# Patient Record
Sex: Male | Born: 1965 | Race: White | Hispanic: No | Marital: Married | Smoking: Never smoker
Health system: Southern US, Community
[De-identification: ages and names within clinical notes are randomized; demographics above are authoritative.]

## PROBLEM LIST (undated history)

## (undated) DIAGNOSIS — K5790 Diverticulosis of intestine, part unspecified, without perforation or abscess without bleeding: Secondary | ICD-10-CM

## (undated) DIAGNOSIS — K55039 Acute (reversible) ischemia of large intestine, extent unspecified: Secondary | ICD-10-CM

## (undated) HISTORY — DX: Diverticulosis of intestine, part unspecified, without perforation or abscess without bleeding: K57.90

## (undated) HISTORY — DX: Acute (reversible) ischemia of large intestine, extent unspecified: K55.039

---

## 2011-09-06 ENCOUNTER — Inpatient Hospital Stay (HOSPITAL_COMMUNITY)
Admission: EM | Admit: 2011-09-06 | Discharge: 2011-09-10 | DRG: 188 | Disposition: A | Discharge status: Home / Self Care | Payer: BC Managed Care – PPO | Source: Ambulatory Visit | Attending: Internal Medicine | Admitting: Internal Medicine

## 2011-09-06 ENCOUNTER — Emergency Department (HOSPITAL_COMMUNITY): Payer: BC Managed Care – PPO

## 2011-09-06 DIAGNOSIS — K559 Vascular disorder of intestine, unspecified: Principal | ICD-10-CM | POA: Diagnosis present

## 2011-09-06 DIAGNOSIS — I498 Other specified cardiac arrhythmias: Secondary | ICD-10-CM | POA: Diagnosis not present

## 2011-09-06 DIAGNOSIS — D72829 Elevated white blood cell count, unspecified: Secondary | ICD-10-CM | POA: Diagnosis present

## 2011-09-06 DIAGNOSIS — A09 Infectious gastroenteritis and colitis, unspecified: Secondary | ICD-10-CM | POA: Diagnosis present

## 2011-09-06 DIAGNOSIS — E876 Hypokalemia: Secondary | ICD-10-CM | POA: Diagnosis not present

## 2011-09-06 DIAGNOSIS — F17200 Nicotine dependence, unspecified, uncomplicated: Secondary | ICD-10-CM | POA: Diagnosis present

## 2011-09-06 DIAGNOSIS — I10 Essential (primary) hypertension: Secondary | ICD-10-CM | POA: Diagnosis present

## 2011-09-06 LAB — CBC
HCT: 43.9 % (ref 39.0–52.0)
MCV: 88.3 fL (ref 78.0–100.0)
Platelets: 236 10*3/uL (ref 150–400)
RBC: 4.97 MIL/uL (ref 4.22–5.81)
WBC: 22.1 10*3/uL — ABNORMAL HIGH (ref 4.0–10.5)

## 2011-09-06 LAB — COMPREHENSIVE METABOLIC PANEL
Albumin: 3.7 g/dL (ref 3.5–5.2)
Alkaline Phosphatase: 88 U/L (ref 39–117)
BUN: 11 mg/dL (ref 6–23)
Chloride: 106 mEq/L (ref 96–112)
Glucose, Bld: 111 mg/dL — ABNORMAL HIGH (ref 70–99)
Potassium: 3.6 mEq/L (ref 3.5–5.1)
Total Bilirubin: 0.7 mg/dL (ref 0.3–1.2)

## 2011-09-06 LAB — URINALYSIS, ROUTINE W REFLEX MICROSCOPIC
Glucose, UA: NEGATIVE mg/dL
Ketones, ur: >80 mg/dL — AB
Leukocytes, UA: NEGATIVE
Specific Gravity, Urine: 1.023 (ref 1.005–1.030)
pH: 5.5 (ref 5.0–8.0)

## 2011-09-06 LAB — DIFFERENTIAL
Lymphocytes Relative: 8 % — ABNORMAL LOW (ref 12–46)
Lymphs Abs: 1.7 10*3/uL (ref 0.7–4.0)
Neutro Abs: 18.9 10*3/uL — ABNORMAL HIGH (ref 1.7–7.7)
Neutrophils Relative %: 86 % — ABNORMAL HIGH (ref 43–77)

## 2011-09-06 LAB — LIPASE, BLOOD: Lipase: 63 U/L — ABNORMAL HIGH (ref 11–59)

## 2011-09-06 MED ORDER — IOHEXOL 300 MG/ML  SOLN
100.0000 mL | Freq: Once | INTRAMUSCULAR | Status: AC | PRN
  Administered 2011-09-06: 100 mL via INTRAVENOUS

## 2011-09-07 ENCOUNTER — Other Ambulatory Visit: Payer: Self-pay | Admitting: Gastroenterology

## 2011-09-07 DIAGNOSIS — R933 Abnormal findings on diagnostic imaging of other parts of digestive tract: Secondary | ICD-10-CM

## 2011-09-07 DIAGNOSIS — K5289 Other specified noninfective gastroenteritis and colitis: Secondary | ICD-10-CM

## 2011-09-07 DIAGNOSIS — K922 Gastrointestinal hemorrhage, unspecified: Secondary | ICD-10-CM

## 2011-09-07 DIAGNOSIS — R1032 Left lower quadrant pain: Secondary | ICD-10-CM

## 2011-09-07 LAB — BASIC METABOLIC PANEL
BUN: 8 mg/dL (ref 6–23)
CO2: 25 mEq/L (ref 19–32)
Calcium: 8.3 mg/dL — ABNORMAL LOW (ref 8.4–10.5)
Chloride: 104 mEq/L (ref 96–112)
Creatinine, Ser: 0.79 mg/dL (ref 0.50–1.35)
Glucose, Bld: 123 mg/dL — ABNORMAL HIGH (ref 70–99)

## 2011-09-07 LAB — CBC
HCT: 40.5 % (ref 39.0–52.0)
HCT: 41.6 % (ref 39.0–52.0)
Hemoglobin: 14.5 g/dL (ref 13.0–17.0)
Hemoglobin: 14.9 g/dL (ref 13.0–17.0)
MCH: 31.6 pg (ref 26.0–34.0)
MCHC: 35.8 g/dL (ref 30.0–36.0)
MCV: 88.2 fL (ref 78.0–100.0)
RBC: 4.59 MIL/uL (ref 4.22–5.81)
RBC: 4.72 MIL/uL (ref 4.22–5.81)
WBC: 20.2 10*3/uL — ABNORMAL HIGH (ref 4.0–10.5)

## 2011-09-08 DIAGNOSIS — K922 Gastrointestinal hemorrhage, unspecified: Secondary | ICD-10-CM

## 2011-09-08 DIAGNOSIS — R933 Abnormal findings on diagnostic imaging of other parts of digestive tract: Secondary | ICD-10-CM

## 2011-09-08 DIAGNOSIS — K5289 Other specified noninfective gastroenteritis and colitis: Secondary | ICD-10-CM

## 2011-09-08 DIAGNOSIS — R1032 Left lower quadrant pain: Secondary | ICD-10-CM

## 2011-09-08 LAB — FECAL LACTOFERRIN, QUANT: Fecal Lactoferrin: POSITIVE

## 2011-09-08 LAB — CBC
HCT: 40.2 % (ref 39.0–52.0)
MCH: 30.8 pg (ref 26.0–34.0)
MCV: 88.4 fL (ref 78.0–100.0)
Platelets: 209 10*3/uL (ref 150–400)
RBC: 4.55 MIL/uL (ref 4.22–5.81)
RDW: 13.1 % (ref 11.5–15.5)

## 2011-09-08 LAB — BASIC METABOLIC PANEL
BUN: 6 mg/dL (ref 6–23)
CO2: 23 mEq/L (ref 19–32)
Calcium: 8.4 mg/dL (ref 8.4–10.5)
Chloride: 104 mEq/L (ref 96–112)
Creatinine, Ser: 0.74 mg/dL (ref 0.50–1.35)

## 2011-09-08 LAB — MAGNESIUM: Magnesium: 1.9 mg/dL (ref 1.5–2.5)

## 2011-09-08 LAB — DIFFERENTIAL
Eosinophils Absolute: 0.4 10*3/uL (ref 0.0–0.7)
Eosinophils Relative: 3 % (ref 0–5)
Lymphocytes Relative: 13 % (ref 12–46)
Lymphs Abs: 2.2 10*3/uL (ref 0.7–4.0)
Monocytes Relative: 7 % (ref 3–12)

## 2011-09-09 DIAGNOSIS — K5289 Other specified noninfective gastroenteritis and colitis: Secondary | ICD-10-CM

## 2011-09-09 DIAGNOSIS — K922 Gastrointestinal hemorrhage, unspecified: Secondary | ICD-10-CM

## 2011-09-09 DIAGNOSIS — R933 Abnormal findings on diagnostic imaging of other parts of digestive tract: Secondary | ICD-10-CM

## 2011-09-09 DIAGNOSIS — R1032 Left lower quadrant pain: Secondary | ICD-10-CM

## 2011-09-09 LAB — DIFFERENTIAL
Basophils Absolute: 0 10*3/uL (ref 0.0–0.1)
Eosinophils Relative: 4 % (ref 0–5)
Lymphocytes Relative: 13 % (ref 12–46)
Lymphs Abs: 1.4 10*3/uL (ref 0.7–4.0)
Neutro Abs: 8.9 10*3/uL — ABNORMAL HIGH (ref 1.7–7.7)

## 2011-09-09 LAB — BASIC METABOLIC PANEL
BUN: 6 mg/dL (ref 6–23)
CO2: 24 mEq/L (ref 19–32)
Chloride: 105 mEq/L (ref 96–112)
Creatinine, Ser: 0.8 mg/dL (ref 0.50–1.35)
GFR calc Af Amer: >60 mL/min (ref >60)
Glucose, Bld: 182 mg/dL — ABNORMAL HIGH (ref 70–99)
Potassium: 3.5 mEq/L (ref 3.5–5.1)

## 2011-09-09 LAB — CBC
HCT: 38.4 % — ABNORMAL LOW (ref 39.0–52.0)
Hemoglobin: 13.8 g/dL (ref 13.0–17.0)
MCV: 87.9 fL (ref 78.0–100.0)
RBC: 4.37 MIL/uL (ref 4.22–5.81)
WBC: 11.3 10*3/uL — ABNORMAL HIGH (ref 4.0–10.5)

## 2011-09-10 ENCOUNTER — Encounter: Payer: Self-pay | Admitting: Gastroenterology

## 2011-09-10 LAB — CBC
HCT: 37.9 % — ABNORMAL LOW (ref 39.0–52.0)
Hemoglobin: 13.2 g/dL (ref 13.0–17.0)
MCHC: 34.8 g/dL (ref 30.0–36.0)
RBC: 4.33 MIL/uL (ref 4.22–5.81)
WBC: 8.4 10*3/uL (ref 4.0–10.5)

## 2011-09-10 LAB — BASIC METABOLIC PANEL
Chloride: 104 mEq/L (ref 96–112)
GFR calc non Af Amer: >90 mL/min (ref >90)
Glucose, Bld: 95 mg/dL (ref 70–99)
Potassium: 3.9 mEq/L (ref 3.5–5.1)
Sodium: 139 mEq/L (ref 135–145)

## 2011-09-10 LAB — GIARDIA/CRYPTOSPORIDIUM SCREEN(EIA): Cryptosporidium Screen (EIA): NEGATIVE

## 2011-09-10 NOTE — H&P (Signed)
NAMEHUDSYN, [place]                ACCOUNT NO.:  [id number]  MEDICAL RECORD NO.:  [id number]  LOCATION:  [facility]                         FACILITY:  [facility]  PHYSICIAN:  Osvaldo Shipper, MD     DATE OF BIRTH:  1966-08-01  DATE OF ADMISSION:  09/06/2011 DATE OF DISCHARGE:                             HISTORY & PHYSICAL   PRIMARY CARE PHYSICIAN:  Paulita Cradle, NP, with [facility].  ADMISSION DIAGNOSES: 1. Colitis of unclear etiology. 2. Tobacco abuse. 3. Elevated lipase.  CHIEF COMPLAINT:  Abdominal pain and bright red blood per rectum.  HISTORY OF PRESENT ILLNESS:  The patient is a 45 year old Caucasian male with a past medical history that is unremarkable.  He does not take any scheduled medications.  He was in his usual state of health until yesterday afternoon when he was at work and he started having lower abdominal cramps.  He felt like he needed to have a bowel movement.  He tried going to the bathroom but did not have any stool.  The pain in the lower abdomen persisted, it was a cramping pain.  He is unable to quantify the intensity of me.  He went home yesterday and then started having bright red blood per rectum.  The pain is located in the left lower abdomen.  He must have had multiple episodes of blood in his stool, although he denies large quantity.  It was small amount, no clots were noted.  This pretty much persisted throughout the night.  This morning, he went to see his doctor and was recommended to come into the emergency department for further evaluation.  He says he has had two episodes of blood in his stool, one was in the early 90s and actually had a colonoscopy at that time.  He does not remember who did the procedure but apparently no cause was found and then, he had another episode about 2 years ago which resolved on its own and he did not seek attention for that.  He had no nausea, vomiting.  The pain is better with morphine.  He  tried taking Imodium with no relief.  He had a temperature of 99.0 Fahrenheit.  He had some chills.  Denies any weight loss.  Denies any recent antibiotic use.  Denies any illicit drug use. No alcohol use.  The patient denied any black-colored stools.  MEDICATIONS AT HOME:  Just ibuprofen as needed, he takes at the most 1-2 tablets on a weekly basis and has not taken any in the last 2 days.  He is not on any scheduled medications.  No other as-needed medications.  ALLERGIES:  VICODIN which causes increased heart rate but he has tolerated Tylenol on its own.  PAST MEDICAL HISTORY:  Really unremarkable for any chronic medical problems.  He had some spinal infection 25 years ago, for which he required surgery and the rest is as above.  SOCIAL HISTORY:  He lives in [place] with his family.  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  Smokes two to three packs of cigarettes on a daily basis.  Drinks once or twice every couple of weeks and denies  any illicit drug use.  FAMILY HISTORY:  Father died of lung cancer.  Mother has mild diabetes. There is some heart disease in the family.  No history of any colon cancer or colitis in the family.  REVIEW OF SYSTEMS:  GENERAL:  Positive for weakness, malaise.  HEENT: Unremarkable.  CARDIOVASCULAR:  Unremarkable.  RESPIRATORY: Unremarkable.  GI:  As in HPI.  GU:  Unremarkable.  NEUROLOGIC: Unremarkable.  PSYCHIATRIC:  Unremarkable.  Other systems reviewed and found to be negative.  PHYSICAL EXAMINATION:  VITAL SIGNS:  Temperature here is 98.9, blood pressure 138/93, heart rate 99, respiratory rate is 18, saturation 99% on room air. GENERAL:  This is a well-developed, well-nourished white male, in no distress. HEENT:  Head is normocephalic, atraumatic.  Pupils are equal and reacting.  No pallor, no icterus.  Oral mucous membrane is moist.  No oral lesions are noted. NECK:  Soft and supple.  No thyromegaly is appreciated.  No  cervical, supraclavicular, or inguinal lymphadenopathy is present. LUNGS:  Clear to auscultation bilaterally with no wheezing, rales, or rhonchi. CARDIOVASCULAR:  S1 and S2 are normal and regular.  No S3, S4, rubs, murmurs, or bruit. ABDOMEN:  Soft.  Minimal tenderness in the left lower quadrant without any rebound, rigidity, or guarding.  No masses or organomegaly is appreciated.  Bowel sounds are present. RECTAL:  Done by the ED staff and stool was heme positive.  He had some maroon-colored stool apparently. MUSCULOSKELETAL:  Normal muscle mass and tone. NEUROLOGIC:  He is alert and oriented x3.  No focal neurological deficits are present. SKIN:  Does not reveal any rashes.  LABORATORY DATA:  His white cell count is 22.1 with 86% neutrophils. His hemoglobin is 16.1, MCV is 88, platelet count is 236.  Coags are normal.  Electrolytes are unremarkable.  Glucose was 111.  Lipase mildly elevated at 63.  UA showed large bilirubin and some ketones, and his fecal occult blood testing was positive.  IMAGING STUDIES:  He had a CT scan of his abdomen and pelvis which showed colitis beginning at the distal transverse colon and ending around the proximal sigmoid colon.  ASSESSMENT:  This is a 45 year old Caucasian male with a past medical history which is unremarkable who presents with abdominal pain, cramping, and bright red blood per rectum.  He has colitis based on his CT scan.  His pancreas appeared to be normal on the CT.  PLAN: 1. Colitis.  Due to the elevated white cell count, we will treat as if     this is infectious and put him on Cipro and Flagyl.  If he has more     diarrhea, stool studies will be sent.  Etiology for his colitis is     not entirely clear.  He may need to have a colonoscopy.  I have     discussed this with Dr. Arlyce Dice with [facility] who will see the     patient in the morning.  The patient was supposed to see [facility]     sometime this week for the same  problem. 2. Tobacco abuse.  He was counseled to quit smoking.  He is declining     a nicotine patch at this time.  If he changes his mind, we will be     happy to prescribe. 3. Elevated lipase, etiology unclear.  It probably is due to his other     GI issues.  There is no suspicion for pancreatitis at this time. 4. DVT  prophylaxis with SCDs.  We will recheck a CBC to monitor his hemoglobin just in case it drops.  The patient is a full code.  Further management and decisions will depend on results of further testing and patient's response to treatment.    Osvaldo Shipper, MD     GK/MEDQ  D:  09/06/2011  T:  09/06/2011  Job:  [id number]  cc:   Paulita Cradle, NP [facility]  Electronically Signed by Osvaldo Shipper MD on 09/10/2011 07:21:29 PM

## 2011-09-11 LAB — STOOL CULTURE

## 2011-09-17 ENCOUNTER — Telehealth: Payer: Self-pay | Admitting: Gastroenterology

## 2011-09-17 NOTE — Discharge Summary (Signed)
[place], [place]                ACCOUNT NO.:  [id number]  MEDICAL RECORD NO.:  [id number]  LOCATION:  [facility]                         FACILITY:  [facility]  PHYSICIAN:  [place] Barefoot, MD    DATE OF BIRTH:  1966-08-15  DATE OF ADMISSION:  09/06/2011 DATE OF DISCHARGE:  09/10/2011                        DISCHARGE SUMMARY - REFERRING   DISCHARGE DIAGNOSES: 1. Severe segmental colitis, ischemic versus infectious. 2. Tobacco use. 3. Hypokalemia secondary to diarrhea, repleted. 4. Leukocytosis secondary to colitis.  DISCHARGE MEDICATIONS: 1. Ciprofloxacin 500 mg p.o. b.i.d. for three more days. 2. Flagyl 500 mg q.8 h. for three more days. 3. Protonix 40 mg p.o. daily.  PROCEDURE PERFORMED:  Colonoscopy show severe segmental colitis, descending to transverse colon.  Mild diverticulosis in the sigmoid colon. Recommend colonoscopy in 6 years at the age of 37, routine colorectal cancer screening.  Procedure performed date was September 07, 2011.  BRIEF HISTORY OF PRESENT ILLNESS:  This is a very pleasant 45 year old with past medical history that is unremarkable.  He does not take any scheduled medication.  He was in his usual state of health until the day prior to admission when he was at work and he was starting having lower abdominal pain.  Crampy in nature.  He felt that he needed to have a bowel movement.  Then, he went home and he started to have bright red blood per rectum.  The pain is located in the left lower quadrant.  He must have had multiple episodes of blood in his stool, although, he denies large quantity.  He had a prior episode of blood in the stool 2 years ago that resolved on its own.  He did not seek any medical attention.  He also had an episode of blood in the stool in the early 90s and had a colonoscopy at that time and apparently no cause was found then.  He had tried taking Imodium with no relief.  He had a temperature of 99.  HOSPITAL COURSE: 1. Severe  segmental colitis.  The patient was admitted to the     hospital, started on IV fluids.  GI was consulted due to prior     episodes to rule out Crohn disease or ischemic colitis, infectious     colitis.  The patient had a colonoscopy which result as above.     Biopsy is pending at this time.  C. diff was negative.  The patient     had a significant elevation on his white blood cell count at 22 on     admission.  These over course of hospitalization decreased.  He was     started on Flagyl and Cipro to cover for infectious organisms.  The     course of hospitalization, the patient abdominal pain improved, his     diarrhea improved also.  No significant amount of blood on the     stool.  When he was able to tolerate diet and he was afebrile and     white blood cell count were trending down, the patient was     discharged home.  The patient will need to have three more days of  antibiotics.  He will need to follow up with Dr. Russella Dar for result     of biopsy. 2. Elevated lipase, very mild elevation in the setting of an acute     illness. 3. Tobacco use.  The patient was counseled to quit smoking.  On the day of discharge, the patient was in improved condition.  No     significant abdominal pain.  No blood in the stool.  He was     tolerating diet.  Sodium 139, potassium 3.9, chloride 104, bicarb 29, BUN 8, creatinine 0.9, glucose 95.  White blood cell 8.4, hemoglobin 12.2, platelet 259. C. diff negative.  Blood pressure 129/86, respirations 16, pulse 68, temperature 98.     [place] Barefoot, MD     BR/MEDQ  D:  09/12/2011  T:  09/12/2011  Job:  [id number]  Electronically Signed by [place] Barefoot MD on 09/17/2011 07:08:10 PM

## 2011-09-17 NOTE — Telephone Encounter (Signed)
I have reviewed the results of the path report with the patient .  He is asked to keep hi follow up appt for 10/02/11

## 2011-09-17 NOTE — Telephone Encounter (Signed)
Left message for patient to call back  

## 2011-10-02 ENCOUNTER — Ambulatory Visit: Payer: BC Managed Care – PPO | Admitting: Gastroenterology

## 2011-10-25 ENCOUNTER — Other Ambulatory Visit: Payer: Self-pay | Admitting: Internal Medicine

## 2012-06-21 IMAGING — CT CT ABD-PELV W/ CM
2 of 5 series · 17 of 46 positions shown, 19 images · IV contrast (APPLIED)
Comparison: None

CLINICAL DATA: abdominal pain and rectal bleeding

CT ABDOMEN AND PELVIS WITH CONTRAST
TECHNIQUE: Multidetector CT imaging of the abdomen and pelvis was
performed following the standard protocol during bolus
administration of intravenous contrast.
Contrast: 100mL OMNIPAQUE IOHEXOL 300 MG/ML IV SOLN

[Series 2: abd/pelv with 5.0 b31f st · axial · 0.77mm/px · z∈[+687,+1122]mm · 14 of 99 slices shown, 16 images]
[im 6/99  soft-tissue]
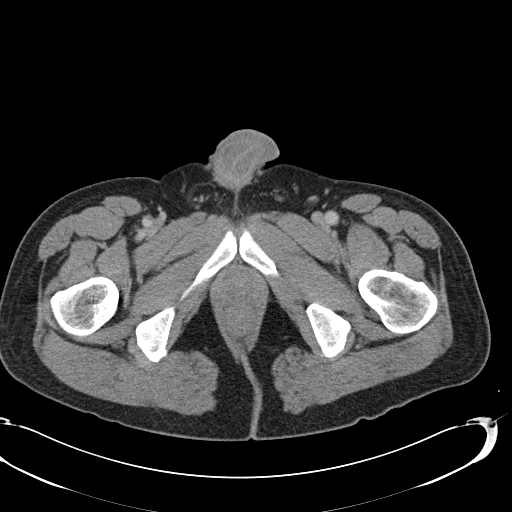
[im 6/99  bone]
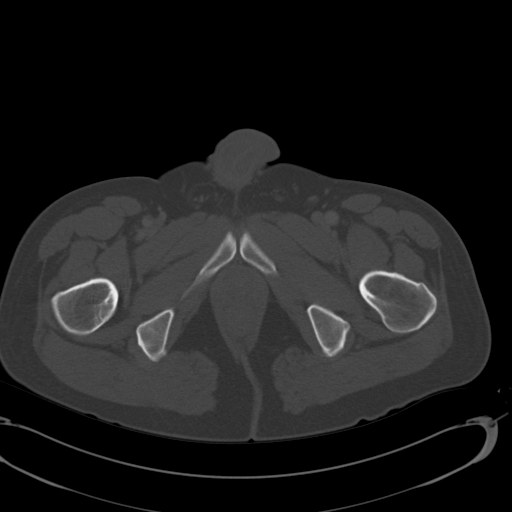
[im 11/99  soft-tissue]
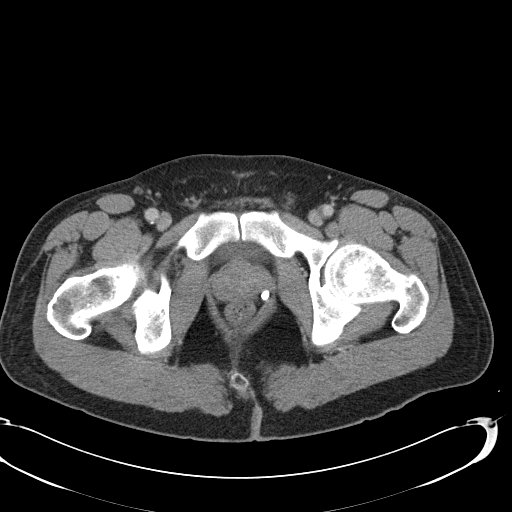
[im 21/99  soft-tissue]
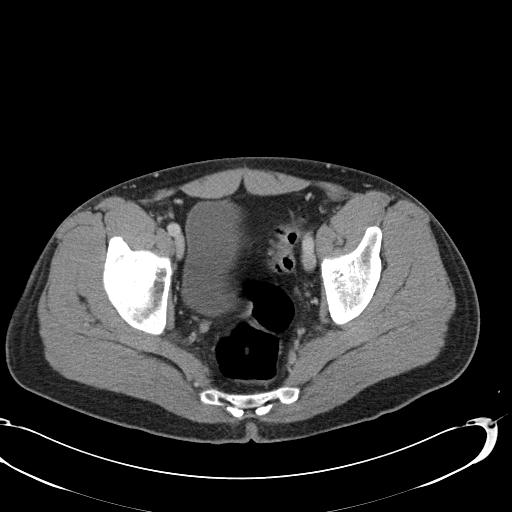
[im 26/99  soft-tissue]
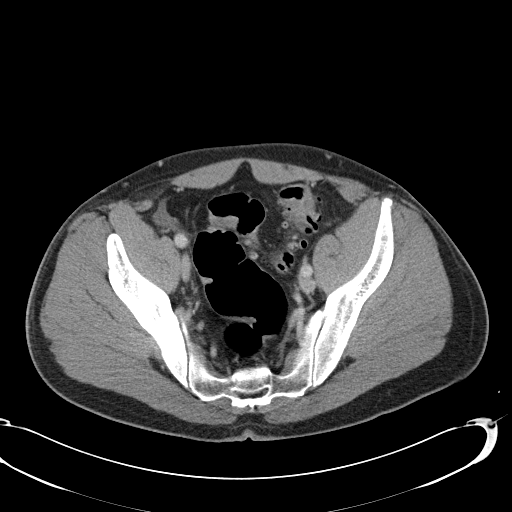
[im 31/99  soft-tissue]
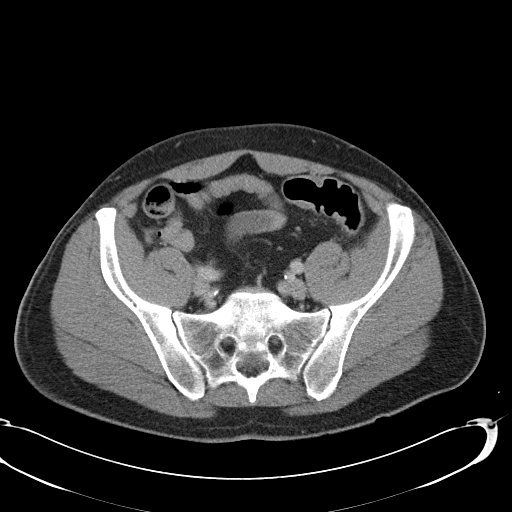
[im 42/99  soft-tissue]
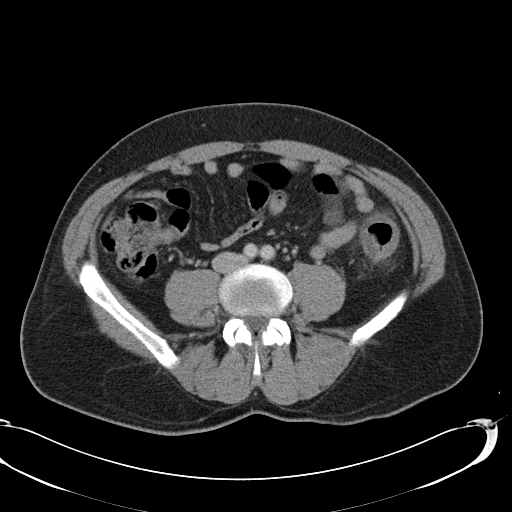
[im 47/99  soft-tissue]
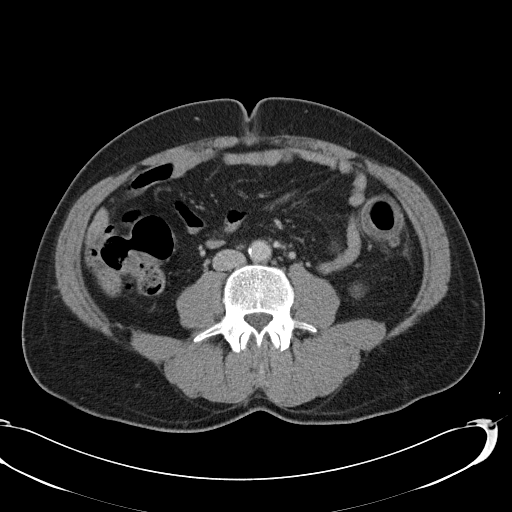
[im 52/99  soft-tissue]
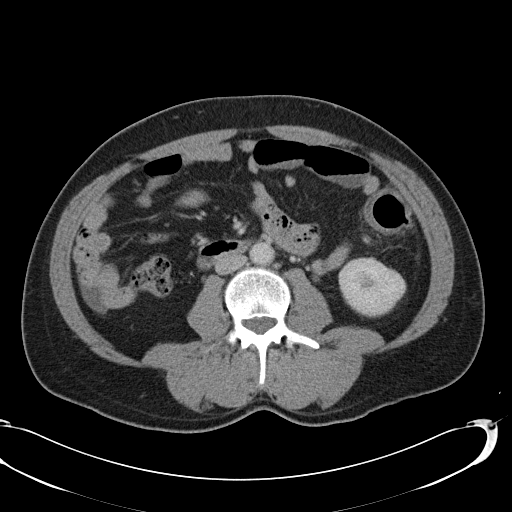
[im 57/99  soft-tissue]
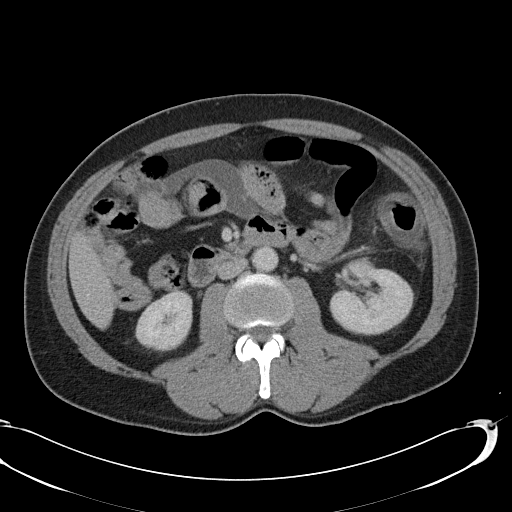
[im 57/99  bone]
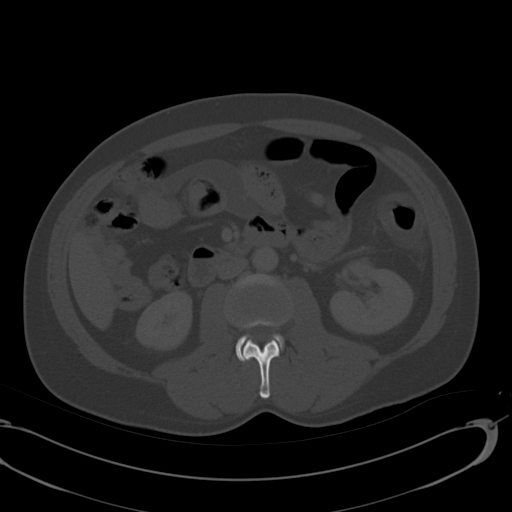
[im 68/99  soft-tissue]
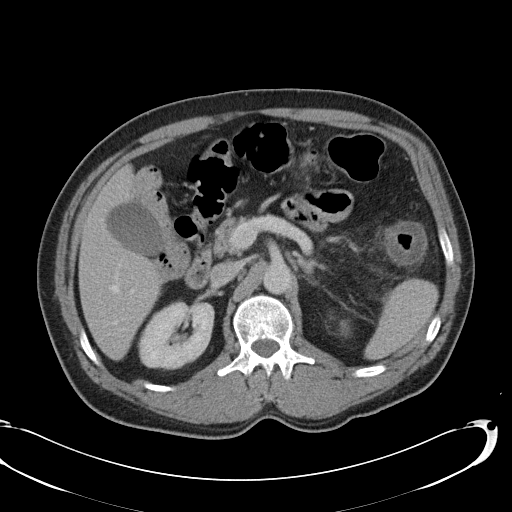
[im 73/99  soft-tissue]
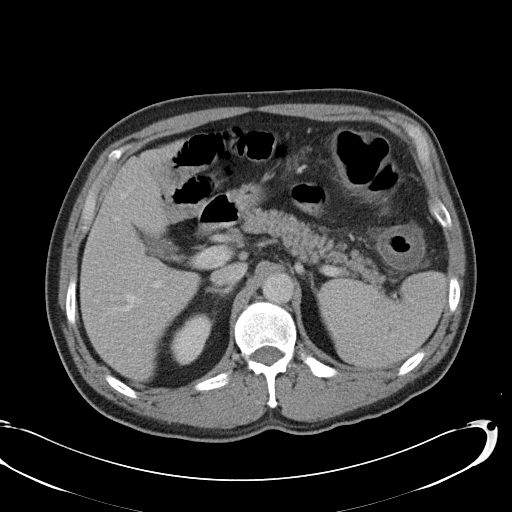
[im 78/99  soft-tissue]
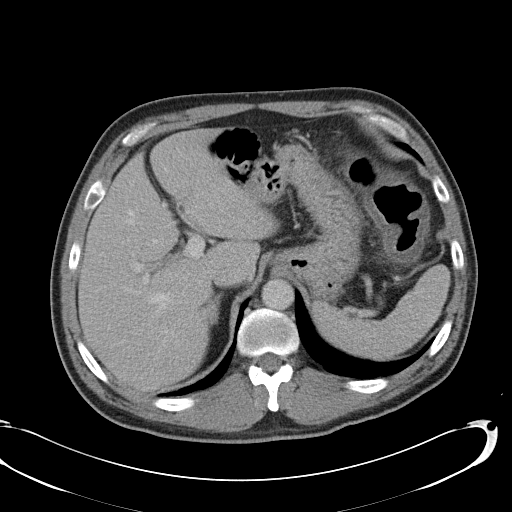
[im 88/99  soft-tissue]
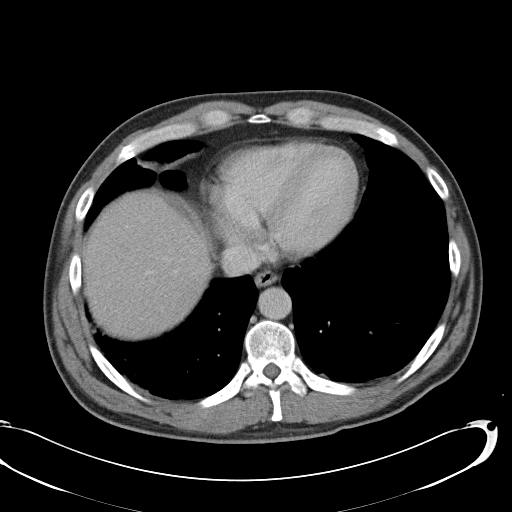
[im 93/99  soft-tissue]
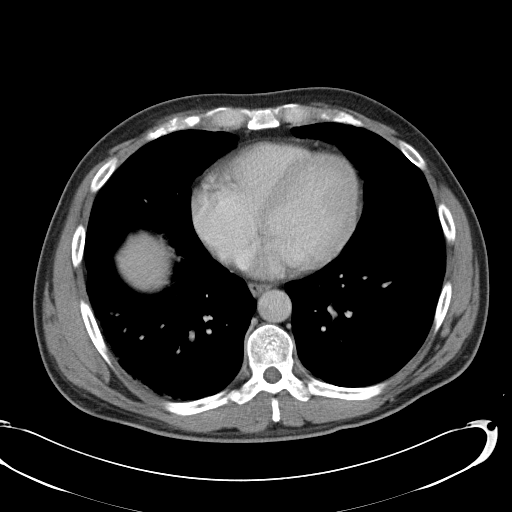

[Series 5: abd/pelv with 2.0 spo st · coronal · 0.96mm/px · 3 of 120 slices shown]
[im 40/120  soft-tissue]
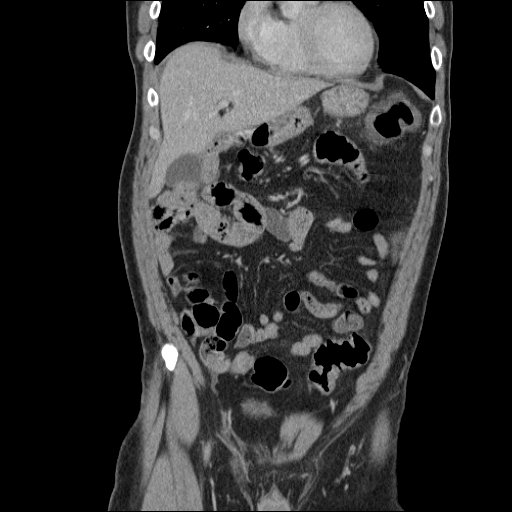
[im 53/120  soft-tissue]
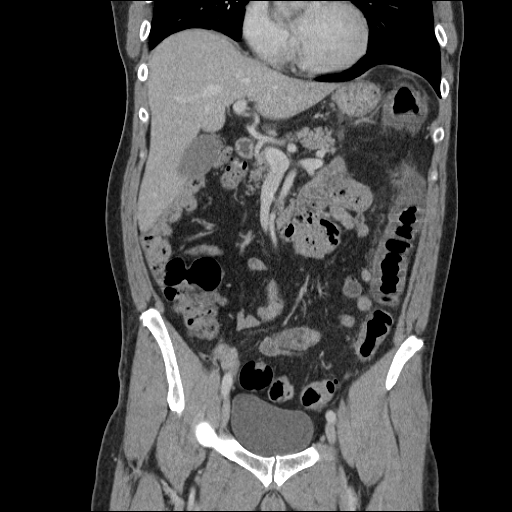
[im 67/120  soft-tissue]
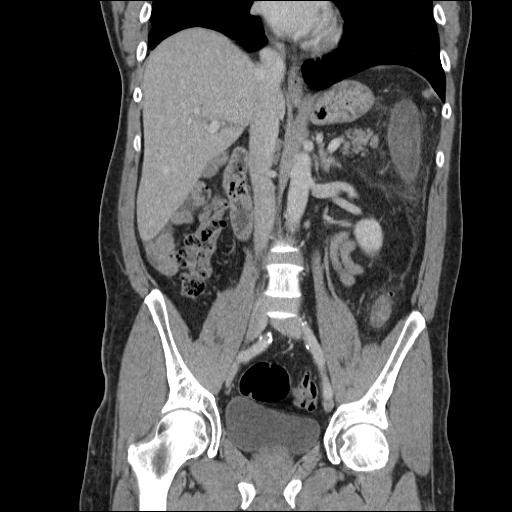

[17 of 46 positions shown; findings below may reference images not displayed]

FINDINGS: Subsegmental atelectasis noted in the right base.  No
pericardial or pleural effusion.

There is a small amount of perihepatic ascites.

No focal liver abnormality.  Gallbladder appears normal.

Pancreas is normal.

There is a normal appearance to the spleen.  The adrenal glands are
normal.  The right kidney is normal.  Indeterminant intermediate
density exophytic lesion arising from the inferior pole of the left
kidney is identified measuring 1.8 cm, image 52.

No upper abdominal adenopathy.

There is no pelvic or inguinal adenopathy.

Urinary bladder appears normal.

The stomach and the small bowel loops are normal.

The appendix is identified and appears normal.

From the distal transverse colon to the proximal sigmoid colon
there is abnormal wall thickening, mucosal enhancement, and
pericolonic inflammatory change compatible with segmental colitis.

A small amount of free fluid is identified within the left abdomen
and extending along the left pericolic gutter.

There is no evidence for bowel perforation, pneumatosis, or free
intraperitoneal air.  No abscess is identified.

Review of the visualized osseous structures is unremarkable.
IMPRESSION: 1.  Examination is positive for colitis which begins at the distal
transverse colon and hands within the proximal sigmoid colon.  This
may be inflammatory or infectious in etiology.  Ischemic colitis is
not excluded.

## 2013-09-07 ENCOUNTER — Other Ambulatory Visit: Payer: Self-pay | Admitting: Nurse Practitioner

## 2013-09-18 ENCOUNTER — Ambulatory Visit (INDEPENDENT_AMBULATORY_CARE_PROVIDER_SITE_OTHER): Payer: BC Managed Care – PPO | Admitting: Family Medicine

## 2013-09-18 ENCOUNTER — Encounter: Payer: Self-pay | Admitting: Family Medicine

## 2013-09-18 ENCOUNTER — Encounter (INDEPENDENT_AMBULATORY_CARE_PROVIDER_SITE_OTHER): Payer: Self-pay

## 2013-09-18 VITALS — BP 136/93 | HR 84 | Temp 97.6°F | Ht 72.0 in | Wt 209.8 lb

## 2013-09-18 DIAGNOSIS — I10 Essential (primary) hypertension: Secondary | ICD-10-CM

## 2013-09-18 DIAGNOSIS — N529 Male erectile dysfunction, unspecified: Secondary | ICD-10-CM

## 2013-09-18 DIAGNOSIS — R0602 Shortness of breath: Secondary | ICD-10-CM

## 2013-09-18 MED ORDER — SILDENAFIL CITRATE 100 MG PO TABS: 50.0000 mg | ORAL_TABLET | Freq: Every day | ORAL | Status: AC | PRN

## 2013-09-18 MED ORDER — LISINOPRIL 20 MG PO TABS: 20.0000 mg | ORAL_TABLET | Freq: Every day | ORAL | Status: AC

## 2013-09-18 MED ORDER — UMECLIDINIUM-VILANTEROL 62.5-25 MCG/INH IN AEPB: 1.0000 | INHALATION_SPRAY | RESPIRATORY_TRACT | Status: DC

## 2013-09-18 NOTE — Progress Notes (Signed)
  Subjective:    Patient ID: [place], male    DOB: March 14, 1966, 47 y.o.   MRN: [id number]  HPI This 47 y.o. male presents for evaluation of hypertension.  He has been out of his blood pressure Medicine for 5 days. He has been a heavy smoker in the past and he has quit and he notices he  Gets SOB when exerting himself..   Review of Systems No chest pain, SOB, HA, dizziness, vision change, N/V, diarrhea, constipation, dysuria, urinary urgency or frequency, myalgias, arthralgias or rash.     Objective:   Physical Exam Vital signs noted  Well developed well nourished male.  HEENT - Head atraumatic Normocephalic                Eyes - PERRLA, Conjuctiva - clear Sclera- Clear EOMI                Ears - EAC's Wnl TM's Wnl Gross Hearing WNL                Nose - Nares patent                 Throat - oropharanx wnl Respiratory - Lungs CTA bilateral Cardiac - RRR S1 and S2 without murmur GI - Abdomen soft Nontender and bowel sounds active x 4 Extremities - No edema. Neuro - Grossly intact.       Assessment & Plan:  Shortness of breath - Plan: Umeclidinium-Vilanterol (ANORO ELLIPTA) 62.5-25 MCG/INH AEPB  Erectile dysfunction - Plan: sildenafil (VIAGRA) 100 MG tablet  Essential hypertension, benign - Plan: lisinopril (PRINIVIL,ZESTRIL) 20 MG tablet  Deatra Canter FNP

## 2013-09-23 NOTE — Patient Instructions (Signed)

## 2013-10-12 ENCOUNTER — Other Ambulatory Visit: Payer: Self-pay | Admitting: Family Medicine

## 2013-10-12 ENCOUNTER — Telehealth: Payer: Self-pay | Admitting: Family Medicine

## 2013-10-12 MED ORDER — IPRATROPIUM-ALBUTEROL 20-100 MCG/ACT IN AERS: 1.0000 | INHALATION_SPRAY | Freq: Four times a day (QID) | RESPIRATORY_TRACT | Status: DC | PRN

## 2013-10-12 NOTE — Telephone Encounter (Signed)
Please call and tell him I sent in a combivent inhaler for him

## 2013-10-14 ENCOUNTER — Telehealth: Payer: Self-pay | Admitting: Family Medicine

## 2013-10-14 NOTE — Telephone Encounter (Signed)
combivent was $130  Can we change to PRo Air hfa- Albuterol Would be MUCH cheaper.

## 2013-10-14 NOTE — Telephone Encounter (Signed)
Note already sent to [facility]

## 2013-10-15 ENCOUNTER — Other Ambulatory Visit: Payer: Self-pay | Admitting: Family Medicine

## 2013-10-15 MED ORDER — ALBUTEROL SULFATE HFA 108 (90 BASE) MCG/ACT IN AERS: 2.0000 | INHALATION_SPRAY | Freq: Four times a day (QID) | RESPIRATORY_TRACT | Status: AC | PRN

## 2013-10-15 NOTE — Telephone Encounter (Signed)
rx for albuterol mdi sent to pharmacy

## 2013-10-16 NOTE — Telephone Encounter (Signed)
Pt's wife notified RX called into pharmacy

## 2014-01-14 ENCOUNTER — Encounter (INDEPENDENT_AMBULATORY_CARE_PROVIDER_SITE_OTHER): Payer: Self-pay

## 2014-01-14 ENCOUNTER — Ambulatory Visit (INDEPENDENT_AMBULATORY_CARE_PROVIDER_SITE_OTHER): Payer: BC Managed Care – PPO | Admitting: Nurse Practitioner

## 2014-01-14 ENCOUNTER — Encounter: Payer: Self-pay | Admitting: Nurse Practitioner

## 2014-01-14 VITALS — BP 137/86 | HR 81 | Temp 97.5°F | Ht 72.0 in | Wt 209.0 lb

## 2014-01-14 DIAGNOSIS — S43109A Unspecified dislocation of unspecified acromioclavicular joint, initial encounter: Secondary | ICD-10-CM

## 2014-01-14 DIAGNOSIS — M25511 Pain in right shoulder: Secondary | ICD-10-CM

## 2014-01-14 DIAGNOSIS — M25519 Pain in unspecified shoulder: Secondary | ICD-10-CM

## 2014-01-14 NOTE — Progress Notes (Signed)
   Subjective:    Patient ID: [place] GlazierDavid Stevenson, male    DOB: 1966-07-06, 48 y.o.   MRN: [id number]  HPI Patient said that he i shaving right shoulder pain that astarted 3 weeks ago- not sure what he did to it- pain is intermittent- hurts worse when he moves arm in certain directions.denies nubness and tingling distally.    Review of Systems  Cardiovascular: Negative.   Gastrointestinal: Negative.   Musculoskeletal: Positive for arthralgias.  Neurological: Negative.   Psychiatric/Behavioral: Negative.   All other systems reviewed and are negative.       Objective:   Physical Exam  Constitutional: He appears well-developed and well-nourished.  Cardiovascular: Normal rate, regular rhythm and normal heart sounds.   Pulmonary/Chest: Effort normal and breath sounds normal.  Musculoskeletal:  Decreased ROM of right shoulder with pain on abduction.  Knot along right AC joint Grips equally bilaterally- motor strength and sensation distally intact.   BP 137/86  Pulse 81  Temp(Src) 97.5 F (36.4 C) (Oral)  Ht 6' (1.829 m)  Wt 209 lb (94.802 kg)  BMI 28.34 kg/m2        Assessment & Plan:   1. Right shoulder pain   2. Acromioclavicular separation, type 1    Motrin or tylenol OTC as needed No strenous activity RTO prn  Mary-Margaret Daphine DeutscherMartin, FNP

## 2014-01-14 NOTE — Patient Instructions (Signed)
Acromioclavicular Injuries  The AC (acromioclavicular) joint is the joint in the shoulder where the collarbone (clavicle) meets the shoulder blade (scapula). The part of the shoulder blade connected to the collarbone is called the acromion. Common problems with and treatments for the AC joint are detailed below.  ARTHRITIS  Arthritis occurs when the joint has been injured and the smooth padding between the joints (cartilage) is lost. This is the wear and tear seen in most joints of the body if they have been overused. This causes the joint to produce pain and swelling which is worse with activity.   AC JOINT SEPARATION  AC joint separation means that the ligaments connecting the acromion of the shoulder blade and collarbone have been damaged, and the two bones no longer line up. AC separations can be anywhere from mild to severe, and are "graded" depending upon which ligaments are torn and how badly they are torn.   Grade I Injury: the least damage is done, and the AC joint still lines up.   Grade II Injury: damage to the ligaments which reinforce the AC joint. In a Grade II injury, these ligaments are stretched but not entirely torn. When stressed, the AC joint becomes painful and unstable.   Grade III Injury: AC and secondary ligaments are completely torn, and the collarbone is no longer attached to the shoulder blade. This results in deformity; a prominence of the end of the clavicle.  AC JOINT FRACTURE  AC joint fracture means that there has been a break in the bones of the AC joint, usually the end of the clavicle.  TREATMENT  TREATMENT OF AC ARTHRITIS   There is currently no way to replace the cartilage damaged by arthritis. The best way to improve the condition is to decrease the activities which aggravate the problem. Application of ice to the joint helps decrease pain and soreness (inflammation). The use of non-steroidal anti-inflammatory medication is helpful.   If less conservative measures do not  work, then cortisone shots (injections) may be used. These are anti-inflammatories; they decrease the soreness in the joint and swelling.   If non-surgical measures fail, surgery may be recommended. The procedure is generally removal of a portion of the end of the clavicle. This is the part of the collarbone closest to your acromion which is stabilized with ligaments to the acromion of the shoulder blade. This surgery may be performed using a tube-like instrument with a light (arthroscope) for looking into a joint. It may also be performed as an open surgery through a small incision by the surgeon. Most patients will have good range of motion within 6 weeks and may return to all activity including sports by 8-12 weeks, barring complications.  TREATMENT OF AN AC SEPARATION   The initial treatment is to decrease pain. This is best accomplished by immobilizing the arm in a sling and placing an ice pack to the shoulder for 20 to 30 minutes every 2 hours as needed. As the pain starts to subside, it is important to begin moving the fingers, wrist, elbow and eventually the shoulder in order to prevent a stiff or "frozen" shoulder. Instruction on when and how much to move the shoulder will be provided by your caregiver. The length of time needed to regain full motion and function depends on the amount or grade of the injury. Recovery from a Grade I AC separation usually takes 10 to 14 days, whereas a Grade III may take 6 to 8 weeks.   Grade   I and II separations usually do not require surgery. Even Grade III injuries usually allow return to full activity with few restrictions. Treatment is also based on the activity demands of the injured shoulder. For example, a high level quarterback with an injured throwing arm will receive more aggressive treatment than someone with a desk job who rarely uses his/her arm for strenuous activities. In some cases, a painful lump may persist which could require a later surgery. Surgery  can be very successful, but the benefits must be weighed against the potential risks.  TREATMENT OF AN AC JOINT FRACTURE  Fracture treatment depends on the type of fracture. Sometimes a splint or sling may be all that is required. Other times surgery may be required for repair. This is more frequently the case when the ligaments supporting the clavicle are completely torn. Your caregiver will help you with these decisions and together you can decide what will be the best treatment.  HOME CARE INSTRUCTIONS    Apply ice to the injury for 15-20 minutes each hour while awake for 2 days. Put the ice in a plastic bag and place a towel between the bag of ice and skin.   If a sling has been applied, wear it constantly for as long as directed by your caregiver, even at night. The sling or splint can be removed for bathing or showering or as directed. Be sure to keep the shoulder in the same place as when the sling is on. Do not lift the arm.   If a figure-of-eight splint has been applied it should be tightened gently by another person every day. Tighten it enough to keep the shoulders held back. Allow enough room to place the index finger between the body and strap. Loosen the splint immediately if there is numbness or tingling in the hands.   Take over-the-counter or prescription medicines for pain, discomfort or fever as directed by your caregiver.   If you or your child has received a follow up appointment, it is very important to keep that appointment in order to avoid long term complications, chronic pain or disability.  SEEK MEDICAL CARE IF:    The pain is not relieved with medications.   There is increased swelling or discoloration that continues to get worse rather than better.   You or your child has been unable to follow up as instructed.   There is progressive numbness and tingling in the arm, forearm or hand.  SEEK IMMEDIATE MEDICAL CARE IF:    The arm is numb, cold or pale.   There is increasing pain  in the hand, forearm or fingers.  MAKE SURE YOU:    Understand these instructions.   Will watch your condition.   Will get help right away if you are not doing well or get worse.  Document Released: 09/05/2005 Document Revised: 02/18/2012 Document Reviewed: 02/28/2009  [employer] Patient Information 2014 [employer]

## 2014-04-12 ENCOUNTER — Ambulatory Visit (INDEPENDENT_AMBULATORY_CARE_PROVIDER_SITE_OTHER): Payer: BC Managed Care – PPO | Admitting: Family Medicine

## 2014-04-12 ENCOUNTER — Ambulatory Visit: Payer: BC Managed Care – PPO | Admitting: Family Medicine

## 2014-04-12 ENCOUNTER — Ambulatory Visit (INDEPENDENT_AMBULATORY_CARE_PROVIDER_SITE_OTHER): Payer: BC Managed Care – PPO

## 2014-04-12 VITALS — BP 113/76 | HR 83 | Temp 98.5°F | Ht 71.0 in | Wt 216.0 lb

## 2014-04-12 DIAGNOSIS — M25511 Pain in right shoulder: Secondary | ICD-10-CM

## 2014-04-12 DIAGNOSIS — M25519 Pain in unspecified shoulder: Secondary | ICD-10-CM

## 2014-04-12 MED ORDER — NAPROXEN 500 MG PO TABS: 500.0000 mg | ORAL_TABLET | Freq: Two times a day (BID) | ORAL | Status: DC

## 2014-04-12 NOTE — Progress Notes (Signed)
   Subjective:    Patient ID: [place] Stevenson, male    DOB: November 15, 1966, 48 y.o.   MRN: [id number]  HPI This 48 y.o. male presents for evaluation of right shoulder pain and discomfort.  He has been having problems moving shoulder around.   Review of Systems    No chest pain, SOB, HA, dizziness, vision change, N/V, diarrhea, constipation, dysuria, urinary urgency or frequency, myalgias, arthralgias or rash.  Objective:   Physical Exam  Vital signs noted  Well developed well nourished male.  HEENT - Head atraumatic Normocephalic Respiratory - Lungs CTA bilateral Cardiac - RRR S1 and S2 without murmur MS - TTP posterior right shoulder and some tenderness with ROM but ROM is good with internal/external rotation and abduction      Assessment & Plan:  Right shoulder pain - Plan: DG Shoulder Right, naproxen (NAPROSYN) 500 MG tablet  Deatra CanterWilliam J [place] Seehafer FNP

## 2014-06-16 ENCOUNTER — Other Ambulatory Visit: Payer: Self-pay | Admitting: Family Medicine

## 2014-07-22 ENCOUNTER — Other Ambulatory Visit: Payer: Self-pay | Admitting: Family Medicine

## 2014-07-22 ENCOUNTER — Telehealth: Payer: Self-pay

## 2014-07-22 ENCOUNTER — Telehealth: Payer: Self-pay | Admitting: Family Medicine

## 2014-07-22 DIAGNOSIS — M25511 Pain in right shoulder: Secondary | ICD-10-CM

## 2014-07-22 NOTE — Telephone Encounter (Signed)
Annette StableBill said that if anti inflammatory did not work [facility]  Wants referral

## 2014-10-06 ENCOUNTER — Other Ambulatory Visit (HOSPITAL_COMMUNITY): Payer: Self-pay | Admitting: Orthopedic Surgery

## 2014-10-06 ENCOUNTER — Ambulatory Visit (HOSPITAL_COMMUNITY)
Admission: RE | Admit: 2014-10-06 | Discharge: 2014-10-06 | Disposition: A | Discharge status: Home / Self Care | Payer: BC Managed Care – PPO | Source: Ambulatory Visit | Attending: Orthopedic Surgery | Admitting: Orthopedic Surgery

## 2014-10-06 DIAGNOSIS — Z01818 Encounter for other preprocedural examination: Secondary | ICD-10-CM | POA: Diagnosis not present

## 2014-10-06 DIAGNOSIS — Z139 Encounter for screening, unspecified: Secondary | ICD-10-CM

## 2014-12-07 ENCOUNTER — Other Ambulatory Visit: Payer: Self-pay | Admitting: Family Medicine

## 2015-07-22 IMAGING — CR DG ORBITS FOR FOREIGN BODY
2 series · 2 of 2 positions shown · non-contrast
Comparison: None.

CLINICAL DATA: Metal working/exposure; clearance prior to MRI

EXAM:
ORBITS FOR FOREIGN BODY - 2 VIEW

[w waters (1 of 2)]
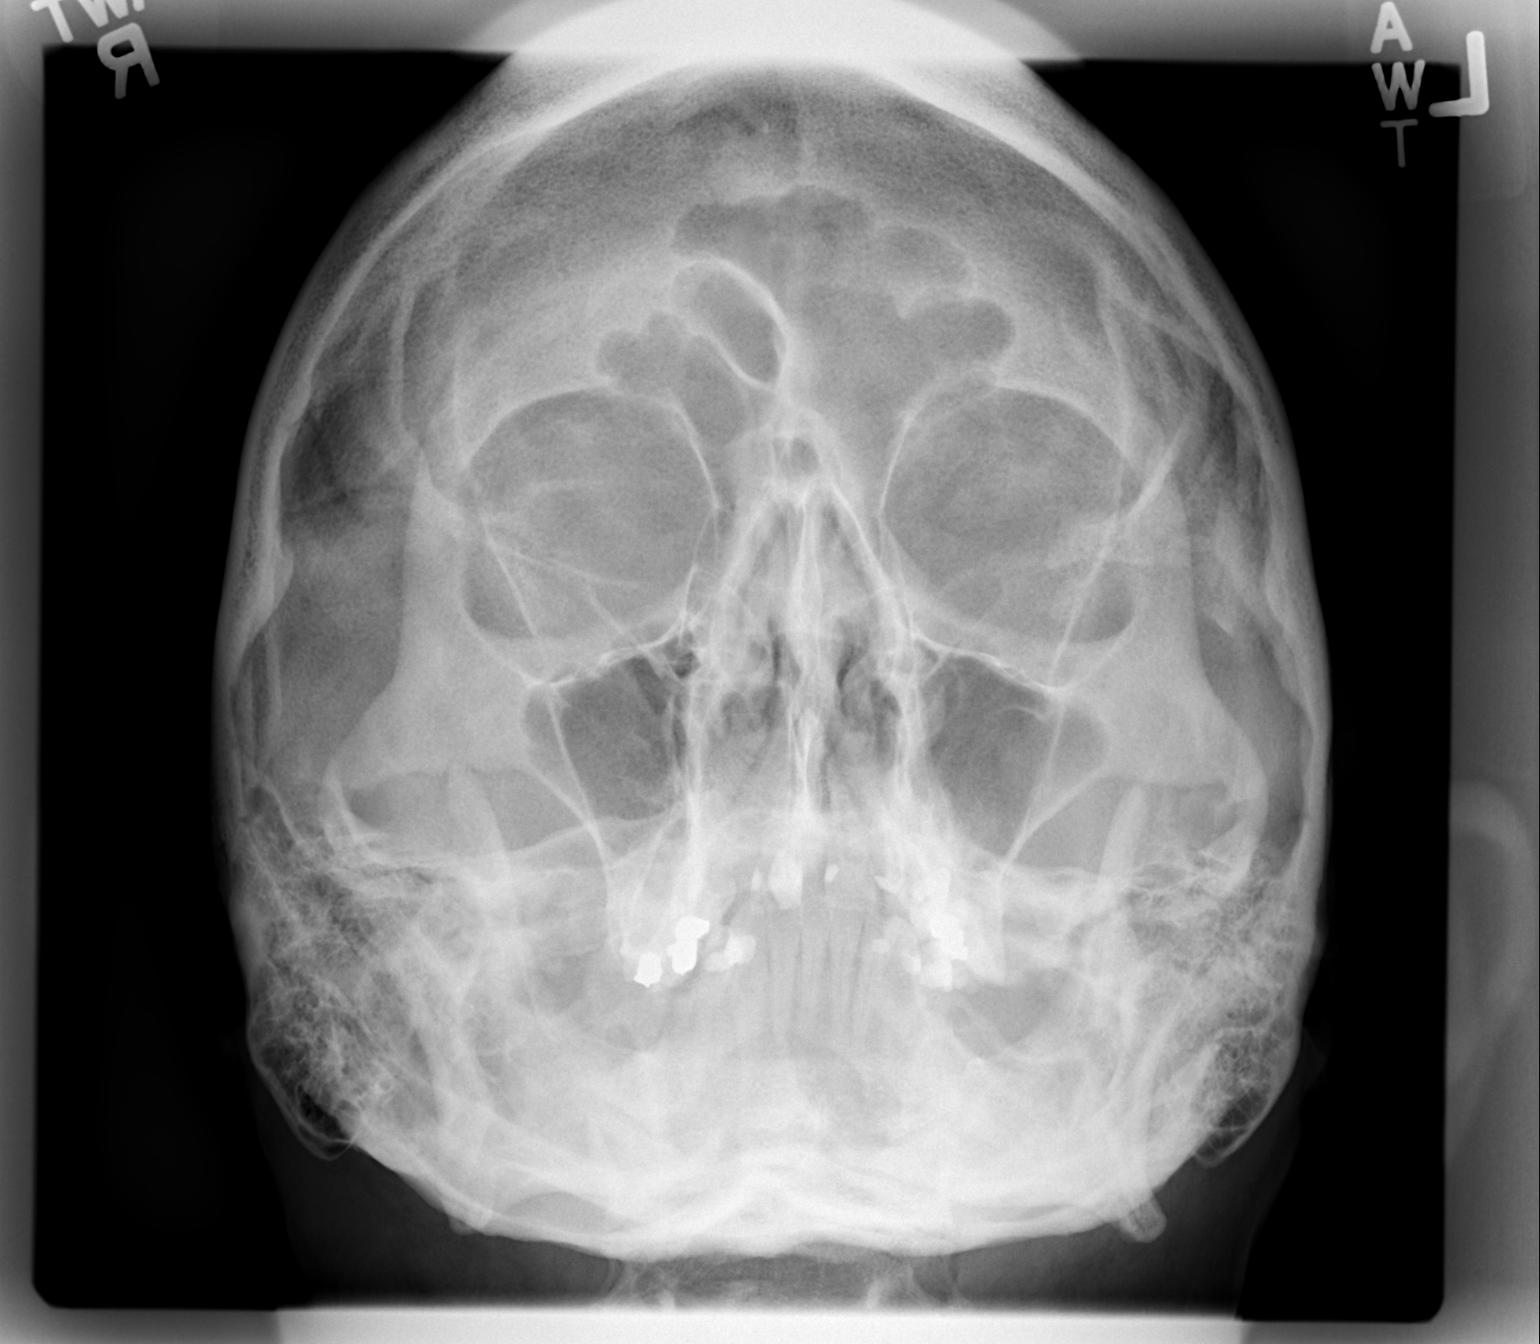

[w waters (2 of 2)]
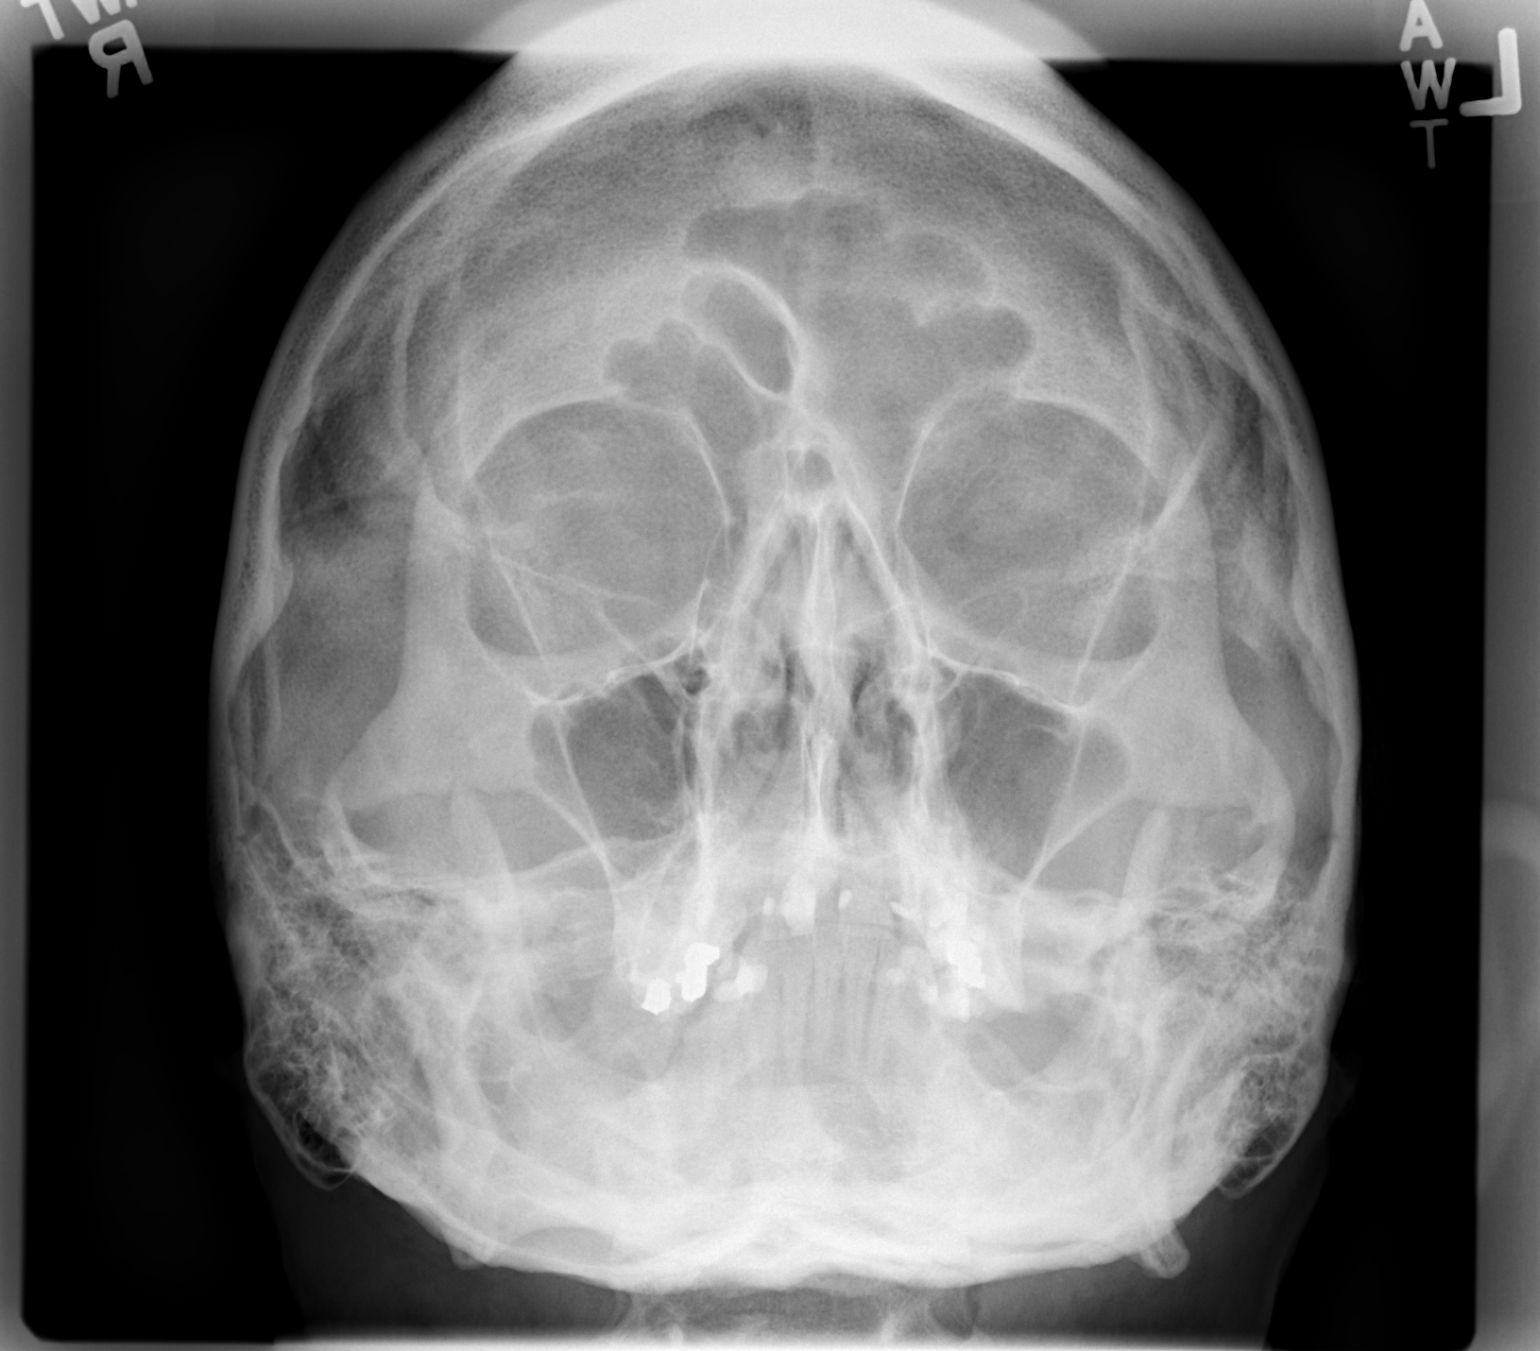

[2 of 2 positions shown; findings below may reference images not displayed]

FINDINGS: There is no evidence of metallic foreign body within the orbits. No
significant bone abnormality identified.
IMPRESSION: No evidence of metallic foreign body within the orbits.

## 2021-06-21 ENCOUNTER — Encounter: Payer: Self-pay | Admitting: Internal Medicine

## 2021-08-30 ENCOUNTER — Ambulatory Visit: Payer: Self-pay

## 2021-08-30 ENCOUNTER — Encounter: Payer: Self-pay | Admitting: Internal Medicine

## 2021-11-14 ENCOUNTER — Encounter: Payer: Self-pay | Admitting: Internal Medicine

## 2022-01-01 ENCOUNTER — Ambulatory Visit: Payer: BC Managed Care – PPO

## 2022-01-01 ENCOUNTER — Encounter: Payer: Self-pay | Admitting: *Deleted
